# Patient Record
Sex: Male | Born: 1995 | Race: Black or African American | Hispanic: No | Marital: Single | Smoking: Never smoker
Health system: Southern US, Community
[De-identification: ages and names within clinical notes are randomized; demographics above are authoritative.]

## PROBLEM LIST (undated history)

## (undated) DIAGNOSIS — L511 Stevens-Johnson syndrome: Secondary | ICD-10-CM

## (undated) DIAGNOSIS — M8580 Other specified disorders of bone density and structure, unspecified site: Secondary | ICD-10-CM

## (undated) DIAGNOSIS — C9590 Leukemia, unspecified not having achieved remission: Secondary | ICD-10-CM

## (undated) DIAGNOSIS — J189 Pneumonia, unspecified organism: Secondary | ICD-10-CM

---
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
## 2014-05-09 ENCOUNTER — Emergency Department
Admission: EM | Admit: 2014-05-09 | Disposition: A | Discharge status: Home / Self Care | Payer: Self-pay | Source: Ambulatory Visit

## 2014-05-09 LAB — HM HIV SCREENING OFFERED

## 2014-05-09 NOTE — ED Notes (Signed)
Epistaxis x 2 hours. Denies history of the same. Hx of seasonal allergies, states he was making dinner when it started. Denies trauma.

## 2014-05-10 ENCOUNTER — Encounter: Payer: Self-pay | Admitting: Emergency Medicine

## 2014-05-10 NOTE — ED Provider Notes (Addendum)
History     Chief Complaint   Patient presents with    Epistaxis       HPI Comments: 18 year old male presents to the emergency department with epistaxis for approximately 4 to 4-1/2 hours, not initially improving with direct pressure.  Patient and mother deny bleeding diathesis, hypocoagulability, prior significant epistaxis.  Patient states the bleed again while he was making dinner, initially at gush of blood which slowed to a trickle.  He reports mild lightheadedness if he stands up too quickly, but denies palpitations, persistent lightheadedness, nausea, vomiting, or the sensation of postnasal drip.      History provided by:  Patient  Language interpreter used: No    Is this ED visit related to civilian activity for income:  Not work related      Past Medical History   Diagnosis Date    Leukemia     Decreased bone density     Stevens-Johnson syndrome     Blocked lacrimal duct 07/13/2011    Corneal abrasion 07/13/2011    Refractive error 07/13/2011            Past Surgical History   Procedure Laterality Date    Nasal lacrimal duct obstruction      Broviak placement         Family History   Problem Relation Age of Onset    Retinal detachment Father     Diabetes Other      paternal great grandmother    High blood pressure Other      paternal great grandmother    Birth defects Neg Hx     SIDS Neg Hx     Sudden death Neg Hx          Social History      reports that he has never smoked. He does not have any smokeless tobacco history on file. He reports that he drinks alcohol. His drug and sexual activity histories are not on file.    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   HENT: Positive for nosebleeds. Negative for sore throat and trouble swallowing.    Eyes: Negative for pain and visual disturbance.   Respiratory: Negative for shortness of breath.    Cardiovascular: Negative for chest pain.   Gastrointestinal: Negative for nausea, vomiting and abdominal pain.   Endocrine: Negative for polydipsia.   Genitourinary: Negative for flank pain.   Musculoskeletal: Negative for gait problem, neck pain and neck stiffness.   Skin: Negative for pallor and rash.   Neurological: Negative for tremors, syncope and headaches.   Psychiatric/Behavioral: Negative for confusion and agitation.  Physical Exam     ED Triage Vitals   BP Heart Rate Heart Rate(via Pulse Ox) Resp Temp Temp Source SpO2 O2 Device O2 Flow Rate   05/09/14 2254 05/09/14 2254 -- 05/09/14 2254 05/09/14 2254 05/09/14 2254 05/09/14 2254 05/09/14 2254 --   147/95 mmHg 73  16 36.7 C (98.1 F) TEMPORAL 95 % None (Room air)       Weight           05/09/14 2254           54.432 kg (120 lb)               Physical Exam   Constitutional: He is oriented to person, place, and time. He appears well-developed and well-nourished. No distress.   HENT:   Head: Normocephalic and atraumatic.   Nose: No septal deviation or nasal septal hematoma. No epistaxis (Small regions along the left anterior nasal septum which appear to be resolving epistaxis without continued bleeding).   Mouth/Throat: Oropharynx is clear and moist. No oropharyngeal exudate.   Eyes: Conjunctivae are normal. Right eye exhibits no discharge. Left eye exhibits no discharge.   Neck: No tracheal deviation present.   Cardiovascular: Normal rate,  regular rhythm and intact distal pulses.    Pulmonary/Chest: Effort normal and breath sounds normal. No stridor. No respiratory distress. He has no wheezes.   Abdominal: Soft. He exhibits no distension. There is no tenderness.   Neurological: He is alert and oriented to person, place, and time. He exhibits normal muscle tone.   Skin: Skin is warm and dry. He is not diaphoretic. No pallor.   Psychiatric: He has a normal mood and affect. His behavior is normal.   Nursing note and vitals reviewed.      Medical Decision Making        Initial Evaluation:  ED First Provider Contact    Date/Time Event User Comments    05/10/14 [phone] ED Provider First Contact FIELDS, AARON Initial Face to Face Provider Contact          Patient seen by me today 05/10/2014 at 0345    Assessment:  18 y.o., male comes to the ED with persistent epistaxis without history of the same, patient and mother deny known bleeding diathesis or hypocoagulability (is not on any blood thinners).  Hemodynamically stable.    Differential Diagnosis includes epistaxis, less likely nasal trauma, bleeding diathesis                Plan:   1.  Epistaxis is controlled at time of my evaluation.  No acute intervention required.  2.  I showed mother how to make a nasal clamp out of tongue depressors, gauze, and tape and demonstrated how to apply it in order to apply direct pressure to the anterior nasal septum for a solid 20-30 minutes without worrying about human fatigue.  I instructed her to assemble a nasal clamp in place it on Damyon's nose if the bleeding recurred, and it did not resolve in 20-30 minutes with direct pressure, to return to the emergency department.  3.  Discharge to home to followup with PCP.  I discuss if he develops recurrent nosebleeds over the next weeks to months, she should followup with his PCP to determine etiology.    Signa Kell, MD              Signa Kell, MD  05/10/14 [phone]    Resident Attestation:     Patient seen by me today,  05/10/2014 at approx 0256  History:   I reviewed this patient, reviewed the resident's note and agree.  Exam:   I examined this patient, reviewed the resident's note and agree.    Decision Making:   I discussed with the resident his/her documented decision making  and agree.      Author Lauretta Grill, MD    Lauretta Grill, MD  05/13/14 2230

## 2014-05-10 NOTE — Discharge Instructions (Signed)
You were seen and evaluated today in the Emergency Department at [facility] for a persistent nosebleed.  During your stay, we performed a Physical exam and found no ongoing bleeding or need for further intervention.  We gave you supplies to make a nose clamp to treat a possible recurrent nosebleed.  At this time, your symptoms have improved, we are discharging you home with plans to follow up with your Primary Care Physician Liliana Cline, Oneal Grout, MD).      If your symptoms return, worsen, or become persistent then we encourage you to seek further care, either with your Primary Care Physician Liliana Cline, Oneal Grout, MD) or here in the Emergency Department.  Specific symptoms to look out for include recurrent or persistent bleeding, worsening lightheadedness, palpitations, or difficulty breathing.  We have included additional educational information and instructions with your discharge packet.  If you have any questions about your care, please refer to this information or ask any nursing or medical staff while you are here in the Emergency Department.  Once you leave, if questions arise, we encourage you to contact your Primary Care Physician Liliana Cline, Oneal Grout, MD) for follow up as needed.

## 2014-05-10 NOTE — ED Notes (Addendum)
Pt presents with c/o nosebleed for about "3 1/2 hours", started while he was making dinner. Denies hx of the same, only seasonal allergies. Pt states the bleed started off as a "gush" and then became a slow trickle. Currently not actively bleeding. Pt in NAD. Will continue to monitor and tx per orders. Denies dizziness, vision changes, HA.

## 2014-05-10 NOTE — ED Notes (Signed)
Pt up and ambulatory without any assistance. Pt tolerating PO. Pt d/c instructions reviewed, pt comfortable with d/c planning. Pt understands d/c instructions and will follow up with PCP. Pt belongings are with pt. Pt safe to d/c at this time. Pt's mother will be driving pt home.

## 2015-11-03 ENCOUNTER — Other Ambulatory Visit
Admission: RE | Admit: 2015-11-03 | Discharge: 2015-11-03 | Disposition: A | Discharge status: Home / Self Care | Payer: Self-pay | Source: Ambulatory Visit | Attending: Pediatrics | Admitting: Pediatrics

## 2015-11-03 LAB — CBC AND DIFFERENTIAL
Baso # K/uL: 0 10*3/uL (ref 0.0–0.1)
Basophil %: 1 %
Eos # K/uL: 0.1 10*3/uL (ref 0.0–0.5)
Eosinophil %: 4 %
Hematocrit: 44 % (ref 40–51)
Hemoglobin: 15 g/dL (ref 13.7–17.5)
IMM Granulocytes #: 0 10*3/uL (ref 0.0–0.1)
IMM Granulocytes: 0.3 %
Lymph # K/uL: 1 10*3/uL — ABNORMAL LOW (ref 1.3–3.6)
Lymphocyte %: 33.8 %
MCH: 30 pg/cell (ref 26–32)
MCHC: 34 g/dL (ref 32–37)
MCV: 87 fL (ref 79–92)
Mono # K/uL: 0.3 10*3/uL (ref 0.3–0.8)
Monocyte %: 10.9 %
Neut # K/uL: 1.5 10*3/uL — ABNORMAL LOW (ref 1.8–5.4)
Nucl RBC # K/uL: 0 10*3/uL (ref 0.0–0.0)
Nucl RBC %: 0 /100 WBC (ref 0.0–0.2)
Platelets: 272 10*3/uL (ref 150–330)
RBC: 5 MIL/uL (ref 4.6–6.1)
RDW: 13.6 % (ref 11.6–14.4)
Seg Neut %: 50 %
WBC: 3 10*3/uL — ABNORMAL LOW (ref 4.2–9.1)

## 2015-11-03 LAB — TSH: TSH: 2.31 u[IU]/mL (ref 0.27–4.20)

## 2015-11-03 LAB — COMPREHENSIVE METABOLIC PANEL
ALT: 15 U/L (ref 0–50)
AST: 31 U/L (ref 0–50)
Albumin: 4.8 g/dL (ref 3.5–5.2)
Alk Phos: 66 U/L (ref 40–130)
Anion Gap: 18 — ABNORMAL HIGH (ref 7–16)
Bilirubin,Total: 0.3 mg/dL (ref 0.0–1.2)
CO2: 22 mmol/L (ref 20–28)
Calcium: 9.6 mg/dL (ref 9.3–10.5)
Chloride: 99 mmol/L (ref 96–108)
Creatinine: 0.98 mg/dL (ref 0.67–1.17)
GFR,Black: 126 *
GFR,Caucasian: 109 *
Glucose: 80 mg/dL (ref 60–99)
Lab: 14 mg/dL (ref 6–20)
Potassium: 4.5 mmol/L (ref 3.3–5.1)
Sodium: 139 mmol/L (ref 133–145)
Total Protein: 7.3 g/dL (ref 6.3–7.7)

## 2015-11-03 LAB — LIPID PANEL
Chol/HDL Ratio: 2.5
Cholesterol: 155 mg/dL
HDL: 62 mg/dL
LDL Calculated: 86 mg/dL
Non HDL Cholesterol: 93 mg/dL
Triglycerides: 33 mg/dL

## 2015-11-05 ENCOUNTER — Encounter: Payer: Self-pay | Admitting: Pediatric Hematology and Oncology

## 2015-11-05 ENCOUNTER — Other Ambulatory Visit
Admission: RE | Admit: 2015-11-05 | Discharge: 2015-11-05 | Disposition: A | Discharge status: Home / Self Care | Payer: Self-pay | Source: Ambulatory Visit | Attending: Pediatric Hematology and Oncology | Admitting: Pediatric Hematology and Oncology

## 2015-11-05 ENCOUNTER — Ambulatory Visit: Payer: Self-pay | Admitting: Pediatric Hematology and Oncology

## 2015-11-05 VITALS — BP 124/78 | HR 61 | Temp 99.3°F | Resp 18 | Ht 65.63 in | Wt 127.0 lb

## 2015-11-05 DIAGNOSIS — C9590 Leukemia, unspecified not having achieved remission: Secondary | ICD-10-CM

## 2015-11-05 DIAGNOSIS — G43909 Migraine, unspecified, not intractable, without status migrainosus: Secondary | ICD-10-CM

## 2015-11-05 LAB — COMPREHENSIVE METABOLIC PANEL
ALT: 15 U/L (ref 0–50)
AST: 30 U/L (ref 0–50)
Albumin: 4.9 g/dL (ref 3.5–5.2)
Alk Phos: 64 U/L (ref 40–130)
Anion Gap: 13 (ref 7–16)
Bilirubin,Total: 0.4 mg/dL (ref 0.0–1.2)
CO2: 27 mmol/L (ref 20–28)
Calcium: 9.9 mg/dL (ref 9.3–10.5)
Chloride: 101 mmol/L (ref 96–108)
Creatinine: 0.98 mg/dL (ref 0.67–1.17)
GFR,Black: 126 *
GFR,Caucasian: 109 *
Glucose: 81 mg/dL (ref 60–99)
Lab: 13 mg/dL (ref 6–20)
Potassium: 4.6 mmol/L (ref 3.3–5.1)
Sodium: 141 mmol/L (ref 133–145)
Total Protein: 7.3 g/dL (ref 6.3–7.7)

## 2015-11-05 LAB — VITAMIN D
25-OH VIT D2: 6 ng/mL
25-OH VIT D3: 15 ng/mL
25-OH Vit Total: 21 ng/mL — ABNORMAL LOW (ref 30–60)

## 2015-11-05 LAB — CBC AND DIFFERENTIAL
Baso # K/uL: 0 10*3/uL (ref 0.0–0.1)
Basophil %: 0.8 %
Eos # K/uL: 0.1 10*3/uL (ref 0.0–0.5)
Eosinophil %: 2.6 %
Hematocrit: 44 % (ref 40–51)
Hemoglobin: 15.3 g/dL (ref 13.7–17.5)
IMM Granulocytes #: 0 10*3/uL (ref 0.0–0.1)
IMM Granulocytes: 0 %
Lymph # K/uL: 1.3 10*3/uL (ref 1.3–3.6)
Lymphocyte %: 33.9 %
MCH: 30 pg/cell (ref 26–32)
MCHC: 35 g/dL (ref 32–37)
MCV: 87 fL (ref 79–92)
Mono # K/uL: 0.4 10*3/uL (ref 0.3–0.8)
Monocyte %: 10.5 %
Neut # K/uL: 2 10*3/uL (ref 1.8–5.4)
Nucl RBC # K/uL: 0 10*3/uL (ref 0.0–0.0)
Nucl RBC %: 0 /100 WBC (ref 0.0–0.2)
Platelets: 256 10*3/uL (ref 150–330)
RBC: 5.1 MIL/uL (ref 4.6–6.1)
RDW: 13.3 % (ref 11.6–14.4)
Seg Neut %: 52.2 %
WBC: 3.9 10*3/uL — ABNORMAL LOW (ref 4.2–9.1)

## 2015-11-05 LAB — LACTATE DEHYDROGENASE: LD: 185 U/L (ref 118–225)

## 2015-11-05 LAB — DUPLICATE SLIDE: Slide Sent To: 4

## 2015-11-05 LAB — NEUTROPHIL #-INSTRUMENT: Neutrophil #-Instrument: 2 10*3/uL

## 2015-11-05 LAB — SEDIMENTATION RATE, AUTOMATED: Sedimentation Rate: 2 mm/hr (ref 0–15)

## 2015-11-05 NOTE — Progress Notes (Addendum)
Pediatric Hematology Oncology Clinic Note    CC: Leukopenia and h/o ALL. Referred by PCP, Dr. Liliana Kirk.    HPI:  Awoke from sleep two days ago with HA. Sharp, R>L, mostly temple region. Developed facial numbness on the right side that lasted several hours (went back to sleep and then woke up again with same symptoms). No vision changes, hearing changes or tinnitus. No nausea at the time (did have some yesterday), phonophobia, or photophobia. No familial or personal history of migraines. Saw PCP in mid-day. Instructed to take some tylenol which helped a bit. Labwork performed and showed normal CMP and CBC remarkable for leukopenia (WBC 3.0 with ANC 1500 and LNC 1000). Previous to this episode, Gregory Kirk was feeling in his usual good health. No recent illness, travel, or change in medications.     Gregory Kirk was last seen in peds hem-onc clinic by Dr. Marilynne Kirk 05/31/2007. Since then, he reports good health. He continues to take Zyrtec for environmental allergies. He was seen by Dr. Narda Kirk (Cardiology) in 08/2011 and had normal TTE and EKG at that time. Routine cardiac screening of ventricular function was recommended every 5 years.    ROS:   Gen- denies fever, weight loss, change in activity/energy  HEENT- denies vision or hearing changes, rhinorrhea, sore throat, dysphagia, oral lesions, odynophagia  Resp- denies dyspnea, cough  CV- denies chest pain, palpitations, LE edema  GI- +nausea. Denies vomiting, abdominal pain, diarrhea, constipation  GU- Normal UOP. Denies dysuria, hematuria  MSK- +chronic knee pain. Denies myalgias, arthralgias, joint swelling or redness  Neuro- +headache, +paresthesias/weakness as above. Denies LOC, lightheadedness, tremor, gait disturbance.  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 CBC showed 12% blasts without significant leukopenia, anemia, or  thrombocytopenia. Mediastinal mass noted on CXR. 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 No evidence of CNS disease at diagnosis.  Treated per [id number]. Randomized to doxorubicin with Zinecard and Erwinia asparaginase. Cumulative dose doxorubicin 300 mg/m2. Also received 1,800 cGy of cranial radiation therapy. Treatment was completed October 1998.    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 CBC significant for 69% blasts with minimal anemia and no thrombocytopenia. Reinduced with vincristine, idarubicin, Decadron, PEG asparaginase, intrathecal ARA-C. Subsequently received another course of therapy with vincristine, Decadron,   PEG-asparaginase, and intrathecal methotrexate. A third induction course of   cyclophosphamide and VP-16 with intrathecal methotrexate was then   administered.     After induction received consolidation with B43-PIP immunotoxin at the [facility] in [place], [place]. He received three cycles,   9/11 - 07/08/1999, 9/20-9/22/2000, and 10/03-10/02/1999. He then received   interim maintenance and was to receive further B43-PAP therapy but the   study was closed. He then completed chemotherapy with   etoposide/cyclophosphamide, methotrexate/6-MP, and ara-C/VP-16. The VP-16 in the fourth cycle needed to be held throughout most of the course due to low counts. Gregory Kirk received 19 cycles of this chemotherapy and completed all therapy two years from relapse in May 2002. Other than neutropenia requiring holding chemotherapy and hypogammaglobulinemia with replacement IV IG, [name] generally tolerated his therapy well. He has had no laboratory or clinical evidence of relapse since completing therapy.     Noted to have mild neutropenia and leukopenia in June 2004 which subsequently improved.  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  Gregory Kirk 10/2003 requiring admission to [facility]. Thought to be 2/2 ibuprofen.     PSurgHx:  Orchiopexy 05/2004    Fam Hx:  Mother with Breast Cancer in 2013    Social Hx:  -Working at [employer]. Going to school [employer]) for marketing, but still working stuff out and will not be in school this semester.  -Living with parents, alternates between [employer] and Dad's house  -Non smoker. Rare alcohol. No recreational drug use.    Meds: zyrtec daily, tylenol PRN    Allergies: reglan, ibuprofen, morphine    Immunizations: UTD except no influenza yet this year      Physical Exam:  Vitals:    11/05/15 1401   BP: 124/78   Pulse: 61   Resp: 18   Temp: 37.4 C (99.3 F)   Weight: 57.6 kg (126 lb 15.8 oz)   Height: 166.7 cm (5' 5.63")     Gen- well appearing, engaged young adult  HEENT- bitemporal diameter appears narrow. No scleral icterus. Right TM normal. Left external canal with cerumen impaction. No rhinorrhea. MMM. No oral lesions.   CV- RRR. No murmurs.   Resp- Easy work of breathing. CTAB. No clubbing  Abd- Soft, nontender, nondistended. Normoactive bowel sounds. No HSM.  GU- Normal circumcised penis. Testicles with smooth contours, no nodules/pain/swelling. Tanner Stage V.  Neuro- CN II-XII grossly intact. Upper and lower strength 5/5 bilaterally. Biceps and patellar reflex 2+ bilaterally. No tremor.   MSK- Left leg minimally shorter than right.   Skin- Hyperpigmented macules on arms, face, back. No bruising or petechiae.    Labs  CMP from 1/9 and repeat today are unremarkable  LDH 185  WBC 3.9 (3.0 on 1/9)  Hb/Hct 15.3/44  Plt 256  ESR 2  Recent Results (from the past 72 hour(s))   CBC and differential    Collection Time: 11/05/15  3:51 PM   Result Value Ref Range    WBC 3.9 (L) 4.2 - 9.1 THOU/uL    RBC 5.1 4.6 - 6.1 MIL/uL    Hemoglobin 15.3 13.7 - 17.5 g/dL    Hematocrit 44 40 - 51 %    MCV 87 79 - 92 fL    MCH 30 26 - 32 pg/cell    MCHC 35 32 - 37 g/dL    RDW 13.3 11.6 - 14.4 %    Platelets 256  150 - 330 THOU/uL    Seg Neut % 52.2 %    Lymphocyte % 33.9 %    Monocyte % 10.5 %    Eosinophil % 2.6 %    Basophil % 0.8 %    Neut # K/uL 2.0 1.8 - 5.4 THOU/uL    Lymph # K/uL 1.3 1.3 - 3.6 THOU/uL    Mono # K/uL 0.4 0.3 - 0.8 THOU/uL    Eos # K/uL 0.1 0.0 - 0.5 THOU/uL    Baso # K/uL 0.0 0.0 - 0.1 THOU/uL    Nucl RBC % 0.0 0.0 - 0.2 /100 WBC    Nucl RBC # K/uL 0.0 0.0 - 0.0 THOU/uL    IMM Granulocytes # 0.0 0.0 - 0.1 THOU/uL    IMM Granulocytes 0.0 %   Comprehensive metabolic panel    Collection Time: 11/05/15  3:51 PM   Result Value Ref Range    Sodium 141 133 - 145 mmol/L  Potassium 4.6 3.3 - 5.1 mmol/L    Chloride 101 96 - 108 mmol/L    CO2 27 20 - 28 mmol/L    Anion Gap 13 7 - 16    UN 13 6 - 20 mg/dL    Creatinine 0.98 0.67 - 1.17 mg/dL    GFR,Caucasian 109 *    GFR,Black 126 *    Glucose 81 60 - 99 mg/dL    Calcium 9.9 9.3 - 10.5 mg/dL    Total Protein 7.3 6.3 - 7.7 g/dL    Albumin 4.9 3.5 - 5.2 g/dL    Bilirubin,Total 0.4 0.0 - 1.2 mg/dL    AST 30 0 - 50 U/L    ALT 15 0 - 50 U/L    Alk Phos 64 40 - 130 U/L   Lactate dehydrogenase    Collection Time: 11/05/15  3:51 PM   Result Value Ref Range    LD 185 118 - 225 U/L   Sedimentation rate, automated    Collection Time: 11/05/15  3:51 PM   Result Value Ref Range    Sedimentation Rate 2 0 - 15 mm/hr   Duplicate slide    Collection Time: 11/05/15  3:51 PM   Result Value Ref Range    Slide Sent 16:46     Slide Sent To 4 to jeff andol    Neutrophil #-Instrument    Collection Time: 11/05/15  3:51 PM   Result Value Ref Range    Neutrophil #-Instrument 2.0 THOU/uL     Imaging:  Gregory Kirk MRI (11/06/15)- normal    Assessment/Plan:  Gregory Kirk is a 20 yo male with h/o high-risk ALL c/b relapse in 2000 with last treatment in 2002 who presents following labwork showing mild leukopenia of 3.0 in setting of acute onset, severe headache with simultaneous facial paresthesias and weakness. Leukopenia now improved on today's blood draw, and neurologic symptoms have resolved.  Although it is unlikely that there is an underlying structural cause for this episode given improvement with minimal intervention, he is at increased risk for second malignancy secondary to his ALL treatment (which did include cranial radiation). Therefore, will pursue further imaging with MRI brain at this time. DDx includes migraine though also seems unlikely given lack of personal or familial history. Lastly, though I do not know of increased risk for multiple sclerosis due to his treatment, this would be a clinical entity with remitting neurologic findings (though seems unlikely given concurrence of headache, gender, no previous neurologic events).    -MRI brain to be scheduled tomorrow  -No need for further CBC monitoring at the moment  -Due to anthracycline exposure, should have regular cardiology follow up every 3-5 years. Last echo done 08/2011.  -Recommend routine follow up with Dr. Marilynne Kirk for long term effects of therapy    Discussed with Dr. Elizabeth Palau, MD  Internal Medicine/Pediatrics Resident, PGY-2    Attending Attestation:    I have seen and examined this patient fully and I have reviewed all pertinent labs and medical records.  I agree with the above note in its entirety.  In summary, Gregory Kirk is a now 20 year old male with history of acute lymphoblastic leukemia, diagnosed in 1996, also relapse of ALL in 2000, and has done well and been in remission since that time.  He did receive cranial radiation and relatively high doses of chemotherapy.  He presented to PCP earlier this week with some numbness and possible migraine symptoms; labs done  which were notable for leukopenia (WBC 3.0).    Given his significant prior treatment (including radiation), we performed a comprehensive workup today, which included the following:    1. All labs repeated and essentially normal.  WBC 3.9 which is improved.  The leukopenia was transient and likely related to viral or benign factors.  All other blood  work normal.    2. MRI brain performed and reassuringly normal, excluding stroke/bleed/pathology that may have been related to his prior treatment.    We recommend follow up in survivor clinic with Dr Daphane Shepherd to evaluate for other survivor surveillance.    Plans discussed with Gregory Kirk, mom, dad.  Unable to reach family by phone today though left message with reassuring results.  Please contact me with any questions/concerns regarding this patient.    Sterling Big, MD MS  Pediatric Hematology/Oncology Attending  Pager [phone]

## 2015-11-06 ENCOUNTER — Ambulatory Visit
Admission: RE | Admit: 2015-11-06 | Discharge: 2015-11-06 | Disposition: A | Discharge status: Home / Self Care | Payer: Self-pay | Source: Ambulatory Visit | Attending: Pediatric Hematology and Oncology | Admitting: Pediatric Hematology and Oncology

## 2015-11-06 MED ORDER — GADOBUTROL 1 MMOL/ML (GADAVIST) IV SOLN *WRAPPED*
7.0000 mL | Freq: Once | INTRAVENOUS | Status: AC
Start: 2015-11-06 — End: 2015-11-06
  Administered 2015-11-06: 7 mL via INTRAVENOUS

## 2015-11-07 DIAGNOSIS — G43909 Migraine, unspecified, not intractable, without status migrainosus: Secondary | ICD-10-CM | POA: Insufficient documentation

## 2017-06-14 DIAGNOSIS — Y9301 Activity, walking, marching and hiking: Secondary | ICD-10-CM | POA: Insufficient documentation

## 2017-06-14 DIAGNOSIS — Y9289 Other specified places as the place of occurrence of the external cause: Secondary | ICD-10-CM | POA: Insufficient documentation

## 2017-06-14 DIAGNOSIS — S7001XA Contusion of right hip, initial encounter: Secondary | ICD-10-CM | POA: Insufficient documentation

## 2017-06-14 DIAGNOSIS — S50311A Abrasion of right elbow, initial encounter: Secondary | ICD-10-CM | POA: Insufficient documentation

## 2017-06-14 DIAGNOSIS — Y999 Unspecified external cause status: Secondary | ICD-10-CM | POA: Insufficient documentation

## 2017-06-14 DIAGNOSIS — S138XXA Sprain of joints and ligaments of other parts of neck, initial encounter: Secondary | ICD-10-CM | POA: Insufficient documentation

## 2017-06-15 ENCOUNTER — Emergency Department (HOSPITAL_COMMUNITY)
Admission: EM | Admit: 2017-06-15 | Discharge: 2017-06-15 | Disposition: A | Discharge status: Home / Self Care | Payer: Self-pay | Attending: Emergency Medicine | Admitting: Emergency Medicine

## 2017-06-15 ENCOUNTER — Encounter (HOSPITAL_COMMUNITY): Payer: Self-pay | Admitting: *Deleted

## 2017-06-15 ENCOUNTER — Emergency Department (HOSPITAL_COMMUNITY): Payer: Self-pay

## 2017-06-15 DIAGNOSIS — S139XXA Sprain of joints and ligaments of unspecified parts of neck, initial encounter: Secondary | ICD-10-CM

## 2017-06-15 DIAGNOSIS — R52 Pain, unspecified: Secondary | ICD-10-CM

## 2017-06-15 DIAGNOSIS — S7001XA Contusion of right hip, initial encounter: Secondary | ICD-10-CM

## 2017-06-15 DIAGNOSIS — S50311A Abrasion of right elbow, initial encounter: Secondary | ICD-10-CM

## 2017-06-15 HISTORY — DX: Pneumonia, unspecified organism: J18.9

## 2017-06-15 HISTORY — DX: Stevens-Johnson syndrome: L51.1

## 2017-06-15 HISTORY — DX: Leukemia, unspecified not having achieved remission: C95.90

## 2017-06-15 NOTE — ED Notes (Signed)
Bed: WTR6 Expected date:  Expected time:  Means of arrival:  Comments: 

## 2017-06-15 NOTE — Discharge Instructions (Signed)
Continue wound care to right elbow. Take tylenol for pain. Try icing your right hip several times a day. Rest. Follow up with family doctor as needed.  Xrays all normal today
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
## 2019-10-09 ENCOUNTER — Telehealth: Payer: Self-pay | Admitting: Physician Assistant

## 2019-10-09 DIAGNOSIS — Z20822 Contact with and (suspected) exposure to covid-19: Secondary | ICD-10-CM

## 2019-10-09 MED ORDER — LOPERAMIDE HCL 2 MG PO CAPS: 2.0000 mg | ORAL_CAPSULE | Freq: Four times a day (QID) | ORAL | 0 refills | Status: AC | PRN

## 2019-10-09 MED ORDER — PROMETHAZINE-DM 6.25-15 MG/5ML PO SYRP: 5.0000 mL | ORAL_SOLUTION | Freq: Four times a day (QID) | ORAL | 0 refills | Status: AC | PRN

## 2019-10-09 NOTE — Progress Notes (Signed)
E-Visit for Corona Virus Screening   Your current symptoms could be consistent with the coronavirus.  Many health care providers can now test patients at their office but not all are.  [facility] has multiple testing sites. For information on our COVID testing locations and hours go to [email or web address]   Testing Information: The COVID-19 Community Testing sites will begin testing BY APPOINTMENT ONLY starting the week of December 15th and December 16th (see go-live dates by location below).  You can begin scheduling online at [email or web address]  If you do not have access to a smart phone or computer you may call [phone] for an appointment. . In addition, starting the week of December 14th we will move INDOORS for testing (see locations below). . Community testing site appointment hours will be 8 a.m. to 3:30 p.m.   Testing Locations: [place] will begin Tuesday December 15th. Closed on Monday December 14th to relocate indoors to [address] [place] [address] [facility] Appointments will begin Wednesday December 16th. Closed on Tuesday December 15th for move to indoors at [place]. [address] [place], [place] [address] [facility] Appointments will begin Wednesday December 16th. Closed on Tuesday December 15th for move to indoors at [address] [place] [address]   We are enrolling you in our [place] for [facility] . Daily you will receive a questionnaire within the [facility] website. Our COVID 19 response team will be monitoring your responses daily.  Please quarantine yourself while awaiting your test results. If you develop fever/cough/breathlessness, please stay home for 10 days with improving symptoms and until you have had 24 hours of no fever (without taking a fever reducer).  You should wear a mask or cloth face covering over your nose and mouth if you must be around other people or animals, including pets (even at  home). Try to stay at least 6 feet away from other people. This will protect the people around you.  Please continue good preventive care measures, including:  frequent hand-washing, avoid touching your face, cover coughs/sneezes, stay out of crowds and keep a 6 foot distance from others.  COVID-19 is a respiratory illness with symptoms that are similar to the flu. Symptoms are typically mild to moderate, but there have been cases of severe illness and death due to the virus.   The following symptoms may appear 2-14 days after exposure: . Fever . Cough . Shortness of breath or difficulty breathing . Chills . Repeated shaking with chills . Muscle pain . Headache . Sore throat . New loss of taste or smell . Fatigue . Congestion or runny nose . Nausea or vomiting . Diarrhea  Go to the nearest hospital ED for assessment if fever/cough/breathlessness are severe or illness seems like a threat to life.  It is vitally important that if you feel that you have an infection such as this virus or any other virus that you stay home and away from places where you may spread it to others.  You should avoid contact with people age 71 and older.   You can use medication such as A prescription cough medication called Phenergan DM 6.25 mg/15 mg. You make take one teaspoon / 5 ml every 4-6 hours as needed for cough   You may use Imodium for diarrhea.  You may also take acetaminophen (Tylenol) as needed for fever.  Reduce your risk of any infection by using the same precautions used for avoiding the common cold or flu:  .  Wash your hands often with soap and warm water for at least 20 seconds.  If soap and water are not readily available, use an alcohol-based hand sanitizer with at least 60% alcohol.  . If coughing or sneezing, cover your mouth and nose by coughing or sneezing into the elbow areas of your shirt or coat, into a tissue or into your sleeve (not your hands). . Avoid shaking hands with others and  consider head nods or verbal greetings only. . Avoid touching your eyes, nose, or mouth with unwashed hands.  . Avoid close contact with people who are sick. . Avoid places or events with large numbers of people in one location, like concerts or sporting events. . Carefully consider travel plans you have or are making. . If you are planning any travel outside or inside the [place], visit the CDC's Travelers' Health webpage for the latest health notices. . If you have some symptoms but not all symptoms, continue to monitor at home and seek medical attention if your symptoms worsen. . If you are having a medical emergency, call 911.  HOME CARE . Only take medications as instructed by your medical team. . Drink plenty of fluids and get plenty of rest. . A steam or ultrasonic humidifier can help if you have congestion.   GET HELP RIGHT AWAY IF YOU HAVE EMERGENCY WARNING SIGNS** FOR COVID-19. If you or someone is showing any of these signs seek emergency medical care immediately. Call 911 or proceed to your closest emergency facility if: . You develop worsening high fever. . Trouble breathing . Bluish lips or face . Persistent pain or pressure in the chest . New confusion . Inability to wake or stay awake . You cough up blood. . Your symptoms become more severe  **This list is not all possible symptoms. Contact your medical provider for any symptoms that are sever or concerning to you.  MAKE SURE YOU   Understand these instructions.  Will watch your condition.  Will get help right away if you are not doing well or get worse.  Your e-visit answers were reviewed by a board certified advanced clinical practitioner to complete your personal care plan.  Depending on the condition, your plan could have included both over the counter or prescription medications.  If there is a problem please reply once you have received a response from your provider.  Your safety is important to [place].  If you have  drug allergies check your prescription carefully.    You can use MyChart to ask questions about today's visit, request a non-urgent call back, or ask for a work or school excuse for 24 hours related to this e-Visit. If it has been greater than 24 hours you will need to follow up with your provider, or enter a new e-Visit to address those concerns. You will get an e-mail in the next two days asking about your experience.  I hope that your e-visit has been valuable and will speed your recovery. Thank you for using e-visits.   Greater than 5 minutes, yet less than 10 minutes of time have been spent researching, coordinating, and implementing care for this patient today

## 2019-10-11 ENCOUNTER — Other Ambulatory Visit: Payer: Self-pay

## 2019-10-13 ENCOUNTER — Telehealth: Payer: Self-pay | Admitting: Physician Assistant

## 2019-10-13 DIAGNOSIS — Z20822 Contact with and (suspected) exposure to covid-19: Secondary | ICD-10-CM

## 2019-10-13 NOTE — Progress Notes (Signed)
I have spent 5 minutes in review of e-visit questionnaire, review and updating patient chart, medical decision making and response to patient.   Ismelda Weatherman Cody Mani Celestin, PA-C    

## 2019-10-13 NOTE — Progress Notes (Signed)
E-Visit for Corona Virus Screening   Your current symptoms could be consistent with the coronavirus. I see you had a visit on 10/09/2019 and were felt to have COVID. Make sure to go get tested. If you had a negative test the day symptoms began I would recommend being retested.  Many health care providers can now test patients at their office but not all are.  [facility] has multiple testing sites. For information on our [place] testing locations and hours go to [email or web address]  We are enrolling you in our [place] for [facility] . Daily you will receive a questionnaire within the [facility] website. Our COVID 19 response team will be monitoring your responses daily.  Testing Information: The COVID-19 Community Testing sites will begin testing BY APPOINTMENT ONLY.  You can schedule online at [email or web address]  If you do not have access to a smart phone or computer you may call [phone] for an appointment.  Testing Locations: Appointment schedule is 8 am to 3:30 pm at all sites  [facility] indoors at [address] [place] [address] [facility]  indoors at [place]. [address] [place], [place] [address] [facility] indoors at [address] [place] [address]  Additional testing sites in the Community:  . For [facility] Testing sites in [facility]  FaceUpdate.uy  . For Pop-up testing sites in [place]  BowlDirectory.co.uk  . For Testing sites with regular hours [name]  . For [place] [email or web address]  . For [facility] BasicJet.[email or web address]  . For [facility] testing in [place] and [employer]  BasicJet.[email or web address]  . For [facility] testing in [facility]   [email or web address].[email or web address]  For  more information about community testing call [phone]   We are enrolling you in our [place] for [place] . Daily you will receive a questionnaire within the [facility] [place] website. Our COVID 19 response team will be monitoring your responses daily.  Please quarantine yourself while awaiting your test results. If you develop fever/cough/breathlessness, please stay home for 10 days with improving symptoms and until you have had 24 hours of no fever (without taking a fever reducer).  You should wear a mask or cloth face covering over your nose and mouth if you must be around other people or animals, including pets (even at home). Try to stay at least 6 feet away from other people. This will protect the people around you.  Please continue good preventive care measures, including:  frequent hand-washing, avoid touching your face, cover coughs/sneezes, stay out of crowds and keep a 6 foot distance from others.  COVID-19 is a respiratory illness with symptoms that are similar to the flu. Symptoms are typically mild to moderate, but there have been cases of severe illness and death due to the virus.   The following symptoms may appear 2-14 days after exposure: . Fever . Cough . Shortness of breath or difficulty breathing . Chills . Repeated shaking with chills . Muscle pain . Headache . Sore throat . New loss of taste or smell . Fatigue . Congestion or runny nose . Nausea or vomiting . Diarrhea  Go to the nearest hospital ED for assessment if fever/cough/breathlessness are severe or illness seems like a threat to life.  It is vitally important that if you feel that you have an infection such as this virus or any other virus that you stay home and away from places where you may spread it to  others.  You  should avoid contact with people age 40 and older.    You may also take acetaminophen (Tylenol) as needed for fever.  Reduce your risk of any infection by using the same precautions used for avoiding the common cold or flu:  Mar[place] Kitchen Wash your hands often with soap and warm water for at least 20 seconds.  If soap and water are not readily available, use an alcohol-based hand sanitizer with at least 60% alcohol.  . If coughing or sneezing, cover your mouth and nose by coughing or sneezing into the elbow areas of your shirt or coat, into a tissue or into your sleeve (not your hands). . Avoid shaking hands with others and consider head nods or verbal greetings only. . Avoid touching your eyes, nose, or mouth with unwashed hands.  . Avoid close contact with people who are sick. . Avoid places or events with large numbers of people in one location, like concerts or sporting events. . Carefully consider travel plans you have or are making. . If you are planning any travel outside or inside the [place], visit the CDC's Travelers' Health webpage for the latest health notices. . If you have some symptoms but not all symptoms, continue to monitor at home and seek medical attention if your symptoms worsen. . If you are having a medical emergency, call 911.  HOME CARE . Only take medications as instructed by your medical team. . Drink plenty of fluids and get plenty of rest. . A steam or ultrasonic humidifier can help if you have congestion.   GET HELP RIGHT AWAY IF YOU HAVE EMERGENCY WARNING SIGNS** FOR COVID-19. If you or someone is showing any of these signs seek emergency medical care immediately. Call 911 or proceed to your closest emergency facility if: . You develop worsening high fever. . Trouble breathing . Bluish lips or face . Persistent pain or pressure in the chest . New confusion . Inability to wake or stay awake . You cough up blood. . Your symptoms become more severe  **This list  is not all possible symptoms. Contact your medical provider for any symptoms that are sever or concerning to you.  MAKE SURE YOU   Understand these instructions.  Will watch your condition.  Will get help right away if you are not doing well or get worse.  Your e-visit answers were reviewed by a board certified advanced clinical practitioner to complete your personal care plan.  Depending on the condition, your plan could have included both over the counter or prescription medications.  If there is a problem please reply once you have received a response from your provider.  Your safety is important to [place].  If you have drug allergies check your prescription carefully.    You can use MyChart to ask questions about today's visit, request a non-urgent call back, or ask for a work or school excuse for 24 hours related to this e-Visit. If it has been greater than 24 hours you will need to follow up with your provider, or enter a new e-Visit to address those concerns. You will get an e-mail in the next two days asking about your experience.  I hope that your e-visit has been valuable and will speed your recovery. Thank you for using e-visits.

## 2019-10-15 ENCOUNTER — Ambulatory Visit (INDEPENDENT_AMBULATORY_CARE_PROVIDER_SITE_OTHER)
Admission: RE | Admit: 2019-10-15 | Discharge: 2019-10-15 | Disposition: A | Discharge status: Home / Self Care | Payer: Self-pay | Source: Ambulatory Visit

## 2019-10-15 ENCOUNTER — Encounter (INDEPENDENT_AMBULATORY_CARE_PROVIDER_SITE_OTHER): Payer: Self-pay

## 2019-10-15 DIAGNOSIS — R0602 Shortness of breath: Secondary | ICD-10-CM

## 2019-10-15 DIAGNOSIS — U071 COVID-19: Secondary | ICD-10-CM

## 2019-10-15 MED ORDER — DM-GUAIFENESIN ER 30-600 MG PO TB12: 1.0000 | ORAL_TABLET | Freq: Two times a day (BID) | ORAL | 0 refills | Status: AC

## 2019-10-15 MED ORDER — BENZONATATE 200 MG PO CAPS: 200.0000 mg | ORAL_CAPSULE | Freq: Three times a day (TID) | ORAL | 0 refills | Status: AC | PRN

## 2019-10-15 MED ORDER — ALBUTEROL SULFATE HFA 108 (90 BASE) MCG/ACT IN AERS: 1.0000 | INHALATION_SPRAY | Freq: Four times a day (QID) | RESPIRATORY_TRACT | 0 refills | Status: AC | PRN

## 2019-10-15 NOTE — ED Provider Notes (Signed)
Virtual Visit via Video Note:  Bradley Dougherty  initiated request for Telemedicine visit with [facility] Urgent Care team. I connected with Bradley Dougherty  on 10/15/2019 at 12:14 PM  for a synchronized telemedicine visit using a video enabled HIPPA compliant telemedicine application. I verified that I am speaking with Bradley Dougherty  using two identifiers. Abeer Iversen C Vieva Brummitt, PA-C  was physically located in a [facility] Urgent care site and Berge Bournes was located at a different location.   The limitations of evaluation and management by telemedicine as well as the availability of in-person appointments were discussed. Patient was informed that he  may incur a bill ( including co-pay) for this virtual visit encounter. Bradley Dougherty  expressed understanding and gave verbal consent to proceed with virtual visit.     History of Present Illness:Bradley Dougherty  is a 23 y.o. male presents for evaluation of shortness of breath in the setting of Covid.  Patient tested positive for Covid last week.  Symptoms began on Tuesday/Wednesday, received positive result on Friday.  Overall he has felt better and is no longer running fevers.  He endorses a slight cough, and some congestion, but overall the symptoms have gotten better.  His main concern is his breathing.  He notes that he has had a chest tightness as well as feeling shortness of breath.  Occasionally will feel short of breath with just resting, but feels short of breath more so with exertion.  States that he has history of asthma when he was younger, but has not had any issues in years.  Denies any tobacco use or smoking.  Denies prior DVT/PE.  Denies any leg pain or leg swelling.  Denies any recent travel.     Allergies  Allergen Reactions  . Motrin [Ibuprofen] Anaphylaxis    Steven-Johnson Syndrome  . Reglan [Metoclopramide]      Past Medical History:  Diagnosis Date  . Leukemia ([place])   . Pneumonia   . Stevens-Johnson syndrome (HCC)      Social History    Tobacco Use  . Smoking status: Never Smoker  . Smokeless tobacco: Never Used  Substance Use Topics  . Alcohol use: No  . Drug use: No        Observations/Objective: Physical Exam  Constitutional: He is oriented to person, place, and time and well-developed, well-nourished, and in no distress. No distress.  HENT:  Head: Normocephalic and atraumatic.  Neck:  Full active range of motion of neck  Pulmonary/Chest: Effort normal. No respiratory distress.  Speaking in full sentences, no coughing during visit  Musculoskeletal:     Comments: No tenderness to palpation of anterior chest  Neurological: He is alert and oriented to person, place, and time.  Speech clear, face symmetric     Assessment and Plan:    ICD-10-CM   1. COVID-19 virus infection  U07.1   2. Shortness of breath  R06.02     Patient appears stable on video, speaking full sentences, does not appear to be in any respiratory distress.  Shortness of breath seems intermittent.  Symptoms most likely side effects from Covid.  Negative risk factors for DVT/PE.  Will treat symptomatically at this time with albuterol inhaler and continued symptomatic and supportive care of Covid, resting of fluids.  Advised if not seeing any improvement with the medicines provided her symptoms worsening to follow-up in person in order to have vitals checked, lungs auscultated and possible EKG/chest x-ray as needed.  Discussed strict return precautions. Patient verbalized  understanding and is agreeable with plan.    Follow Up Instructions:     I discussed the assessment and treatment plan with the patient. The patient was provided an opportunity to ask questions and all were answered. The patient agreed with the plan and demonstrated an understanding of the instructions.   The patient was advised to call back or seek an in-person evaluation if the symptoms worsen or if the condition fails to improve as anticipated.      Janith Lima, PA-C  10/15/2019 12:14 PM         Janith Lima, PA-C 10/15/19 1215

## 2019-10-15 NOTE — Discharge Instructions (Addendum)
Albuterol inhaler as needed for shortness of breath, chest tightness Tessalon for cough as needed every 8 hours Mucinex DM twice daily for further congestion  Follow up in person if not improving or symptoms worsening

## 2019-10-16 ENCOUNTER — Encounter (INDEPENDENT_AMBULATORY_CARE_PROVIDER_SITE_OTHER): Payer: Self-pay

## 2019-10-17 ENCOUNTER — Encounter (INDEPENDENT_AMBULATORY_CARE_PROVIDER_SITE_OTHER): Payer: Self-pay

## 2019-10-19 ENCOUNTER — Encounter (INDEPENDENT_AMBULATORY_CARE_PROVIDER_SITE_OTHER): Payer: Self-pay

## 2019-10-20 ENCOUNTER — Encounter (INDEPENDENT_AMBULATORY_CARE_PROVIDER_SITE_OTHER): Payer: Self-pay

## 2019-10-22 ENCOUNTER — Encounter (INDEPENDENT_AMBULATORY_CARE_PROVIDER_SITE_OTHER): Payer: Self-pay

## 2019-10-23 ENCOUNTER — Encounter (INDEPENDENT_AMBULATORY_CARE_PROVIDER_SITE_OTHER): Payer: Self-pay

## 2019-10-24 ENCOUNTER — Encounter (INDEPENDENT_AMBULATORY_CARE_PROVIDER_SITE_OTHER): Payer: Self-pay

## 2020-09-05 ENCOUNTER — Ambulatory Visit (HOSPITAL_COMMUNITY): Payer: Self-pay

## 2020-09-06 ENCOUNTER — Telehealth: Payer: Managed Care, Other (non HMO) | Admitting: Emergency Medicine

## 2020-09-06 ENCOUNTER — Encounter (HOSPITAL_COMMUNITY): Payer: Self-pay

## 2020-09-06 ENCOUNTER — Other Ambulatory Visit: Payer: Self-pay

## 2020-09-06 ENCOUNTER — Ambulatory Visit (HOSPITAL_COMMUNITY)
Admission: EM | Admit: 2020-09-06 | Discharge: 2020-09-06 | Disposition: A | Discharge status: Home / Self Care | Payer: Managed Care, Other (non HMO) | Attending: Emergency Medicine | Admitting: Emergency Medicine

## 2020-09-06 DIAGNOSIS — S0990XA Unspecified injury of head, initial encounter: Secondary | ICD-10-CM

## 2020-09-06 DIAGNOSIS — S060X0A Concussion without loss of consciousness, initial encounter: Secondary | ICD-10-CM | POA: Diagnosis not present

## 2020-09-06 DIAGNOSIS — R110 Nausea: Secondary | ICD-10-CM

## 2020-09-06 MED ORDER — DEXAMETHASONE SODIUM PHOSPHATE 10 MG/ML IJ SOLN
INTRAMUSCULAR | Status: AC
  Filled 2020-09-06: qty 1

## 2020-09-06 MED ORDER — NAPROXEN 500 MG PO TABS: 500.0000 mg | ORAL_TABLET | Freq: Two times a day (BID) | ORAL | 0 refills | Status: AC

## 2020-09-06 MED ORDER — ONDANSETRON 4 MG PO TBDP: 4.0000 mg | ORAL_TABLET | Freq: Three times a day (TID) | ORAL | 0 refills | Status: AC | PRN

## 2020-09-06 MED ORDER — DEXAMETHASONE 10 MG/ML FOR PEDIATRIC ORAL USE
10.0000 mg | Freq: Once | INTRAMUSCULAR | Status: AC
  Administered 2020-09-06: 10 mg via ORAL

## 2020-09-06 MED ORDER — CYCLOBENZAPRINE HCL 5 MG PO TABS: 5.0000 mg | ORAL_TABLET | Freq: Two times a day (BID) | ORAL | 0 refills | Status: AC | PRN

## 2020-09-06 NOTE — Progress Notes (Signed)
Time spent : 5 min  Based on what you shared with me, I feel your condition warrants further evaluation and I recommend that you be seen for a face to face office visit.   NOTE: If you entered your credit card information for this eVisit, you will not be charged. You may see a "hold" on your card for the $35 but that hold will drop off and you will not have a charge processed.   If you are having a true medical emergency please call 911.      For an urgent face to face visit, [facility] has five urgent care centers for your convenience:     [facility] [place] [facility] [place] Get Driving Directions [phone] [address] [place], [place] [address] . 10 am - 6pm Monday - Friday    [facility]) Get Driving Directions [phone] [address] [place], [place] [address] . 10 am to 8 pm Monday-Friday . 12 pm to 8 pm [facility] Get Driving Directions [phone] [place], [place], [place] [address] . 8 am to 8 pm Monday-Friday . 9 am to 6 pm Saturday . 11 am to 6 pm Sunday     [facility] at [facility] Get Driving Directions  [phone] [address] [place], [place] [address] . 8 am to 8 pm Monday-Friday . 8 am to 4 pm [facility] at [place] Get Driving Directions [phone] [address] [place], [place] [address] . 12 pm to 6 pm Monday-Friday      Your e-visit answers were reviewed by a board certified advanced clinical practitioner to complete your personal care plan.  Thank you for using e-Visits.

## 2020-09-06 NOTE — ED Provider Notes (Signed)
[place]    CSN: [id number] Arrival date & time: 09/06/20  1215      History   Chief Complaint Chief Complaint  Patient presents with  . Head Injury    HPI Bradley Dougherty is a 24 y.o. male resenting today for evaluation of headache. Reports head injury approximately 3 to 4 weeks ago.  Hit left frontal area on corner of metal box while at work.  Denies LOC.  Did have immediate pain, headache worsened today to and had associated dizziness and nausea.  Denies any vision changes.  Over the past month since initial incident he has had intermittent headaches which have come and gone.  Headaches are in various places across the head.  Occasionally will have nausea.  Reports one episode of vomiting.  Typically taking Tylenol with improvement of headache.  Over the past couple days his headache has been more persistent and has reported associated neck stiffness.  He denies any fevers.  Denies any weakness or difficulty speaking.  Denies significant history of headaches prior to incident.  Denies photophobia.  Denies any difficulty concentrating.  Denies any recent dizziness or lightheadedness.  Reviewed allergies with patient, reports Stevens-Johnson syndrome which is believed to be more related to Reglan over ibuprofen, he was given these medicines together in the past.  He reports that he has taken ibuprofen for his headaches recently since without any reaction.   HPI  Past Medical History:  Diagnosis Date  . Leukemia ([place])   . Pneumonia   . Stevens-Johnson syndrome (HCC)     There are no problems to display for this patient.   History reviewed. No pertinent surgical history.     Home Medications    Prior to Admission medications   Medication Sig Start Date End Date Taking? Authorizing Provider  albuterol (VENTOLIN HFA) 108 (90 Base) MCG/ACT inhaler Inhale 1-2 puffs into the lungs every 6 (six) hours as needed for wheezing or shortness of breath. 10/15/19   Marisela Line,  Breean Nannini C, PA-C  cyclobenzaprine (FLEXERIL) 5 MG tablet Take 1-2 tablets (5-10 mg total) by mouth 2 (two) times daily as needed for muscle spasms. 09/06/20   Nimah Uphoff C, PA-C  dextromethorphan-guaiFENesin (MUCINEX DM) 30-600 MG 12hr tablet Take 1 tablet by mouth 2 (two) times daily. 10/15/19   Nuchem Grattan C, PA-C  loperamide (IMODIUM) 2 MG capsule Take 1 capsule (2 mg total) by mouth 4 (four) times daily as needed for diarrhea or loose stools. 10/09/19   Muthersbaugh, Jarrett Soho, PA-C  naproxen (NAPROSYN) 500 MG tablet Take 1 tablet (500 mg total) by mouth 2 (two) times daily. 09/06/20   Zuriel Yeaman C, PA-C  ondansetron (ZOFRAN ODT) 4 MG disintegrating tablet Take 1 tablet (4 mg total) by mouth every 8 (eight) hours as needed for nausea or vomiting. 09/06/20   Venora Kautzman C, PA-C  promethazine-dextromethorphan (PROMETHAZINE-DM) 6.25-15 MG/5ML syrup Take 5 mLs by mouth 4 (four) times daily as needed for cough. 10/09/19   Muthersbaugh, Jarrett Soho, PA-C    Family History Family History  Family history unknown: Yes    Social History Social History   Tobacco Use  . Smoking status: Never Smoker  . Smokeless tobacco: Never Used  Substance Use Topics  . Alcohol use: No  . Drug use: No     Allergies   Motrin [ibuprofen] and Reglan [metoclopramide]   Review of Systems Review of Systems  Constitutional: Negative for fatigue and fever.  HENT: Negative for congestion, sinus pressure and sore throat.  Eyes: Negative for photophobia, pain and visual disturbance.  Respiratory: Negative for cough and shortness of breath.   Cardiovascular: Negative for chest pain.  Gastrointestinal: Positive for nausea. Negative for abdominal pain and vomiting.  Genitourinary: Negative for decreased urine volume and hematuria.  Musculoskeletal: Negative for myalgias, neck pain and neck stiffness.  Neurological: Positive for headaches. Negative for dizziness, syncope, facial asymmetry, speech  difficulty, weakness, light-headedness and numbness.     Physical Exam Triage Vital Signs ED Triage Vitals  Enc Vitals Group     BP 09/06/20 1246 120/77     Pulse Rate 09/06/20 1246 60     Resp 09/06/20 1246 18     Temp 09/06/20 1246 98.2 F (36.8 C)     Temp Source 09/06/20 1246 Oral     SpO2 09/06/20 1246 96 %     Weight --      Height --      Head Circumference --      Peak Flow --      Pain Score 09/06/20 1245 5     Pain Loc --      Pain Edu? --      Excl. in [place]? --    No data found.  Updated Vital Signs BP 120/77 (BP Location: Right Arm)   Pulse 60   Temp 98.2 F (36.8 C) (Oral)   Resp 18   SpO2 96%   Visual Acuity Right Eye Distance:   Left Eye Distance:   Bilateral Distance:    Right Eye Near:   Left Eye Near:    Bilateral Near:     Physical Exam Vitals and nursing note reviewed.  Constitutional:      Appearance: He is well-developed.     Comments: No acute distress  HENT:     Head: Normocephalic and atraumatic.     Ears:     Comments: Bilateral ears without tenderness to palpation of external auricle, tragus and mastoid, EAC's without erythema or swelling, TM's with good bony landmarks and cone of light. Non erythematous.     Nose: Nose normal.     Mouth/Throat:     Comments: Oral mucosa pink and moist, no tonsillar enlargement or exudate. Posterior pharynx patent and nonerythematous, no uvula deviation or swelling. Normal phonation. Eyes:     Conjunctiva/sclera: Conjunctivae normal.  Cardiovascular:     Rate and Rhythm: Normal rate.  Pulmonary:     Effort: Pulmonary effort is normal. No respiratory distress.     Comments: Breathing comfortably at rest, CTABL, no wheezing, rales or other adventitious sounds auscultated Abdominal:     General: There is no distension.  Musculoskeletal:        General: Normal range of motion.     Cervical back: Neck supple.  Skin:    General: Skin is warm and dry.  Neurological:     General: No focal deficit  present.     Mental Status: He is alert and oriented to person, place, and time. Mental status is at baseline.     Comments: Patient A&O x3, cranial nerves II-XII grossly intact, strength at shoulders, hips and knees 5/5, equal bilaterally, patellar reflex 1+ bilaterally. . Negative Romberg and Pronator Drift. Gait without abnormality.      UC Treatments / Results  Labs (all labs ordered are listed, but only abnormal results are displayed) Labs Reviewed - No data to display  EKG   Radiology No results found.  Procedures Procedures (including critical care time)  Medications Ordered in  UC Medications  dexamethasone (DECADRON) 10 MG/ML injection for Pediatric ORAL use 10 mg (has no administration in time range)    Initial Impression / Assessment and Plan / UC Course  I have reviewed the triage vital signs and the nursing notes.  Pertinent labs & imaging results that were available during my care of the patient were reviewed by me and considered in my medical decision making (see chart for details).     Providing Decadron p.o. and avoiding Toradol/Reglan as part of migraine cocktail, given reported allergies.  Recommending continued treatment of symptoms with Tylenol, provided Naprosyn as he reports he has tolerated ibuprofen recently without reaction, Zofran for nausea and Flexeril for neck stiffness/spasming.  Recommended following up with sports medicine/primary care as he did have some concussion-like symptoms initially after incident as well as he has had new pattern of recurrent headaches, recommended follow-up with PCP, may need neurology referral if persistent. At baseline, no recent worsening or changes, do not feel patient warrants emergent imaging at this time.  Continue to monitor.  Discussed strict return precautions. Patient verbalized understanding and is agreeable with plan.  Final Clinical Impressions(s) / UC Diagnoses   Final diagnoses:  Minor head injury,  initial encounter  Concussion without loss of consciousness, initial encounter     Discharge Instructions     We gave you decadron Continue tylenol as needed for headaches, may try naprosyn as alternative Zofran for nausea- dissolves in mouth You may use flexeril as needed to help with neck pain/stiffness. This is a muscle relaxer and causes sedation- please use only at bedtime or when you will be home and not have to drive/work  Follow up with primary care/sports medicine for follow up of concussion symptoms/ new recurrent headaches     ED Prescriptions    Medication Sig Dispense Auth. Provider   naproxen (NAPROSYN) 500 MG tablet Take 1 tablet (500 mg total) by mouth 2 (two) times daily. 30 tablet Lindley Stachnik C, PA-C   ondansetron (ZOFRAN ODT) 4 MG disintegrating tablet Take 1 tablet (4 mg total) by mouth every 8 (eight) hours as needed for nausea or vomiting. 20 tablet Stephenson Cichy C, PA-C   cyclobenzaprine (FLEXERIL) 5 MG tablet Take 1-2 tablets (5-10 mg total) by mouth 2 (two) times daily as needed for muscle spasms. 24 tablet Jerel Sardina, [place] C, PA-C     PDMP not reviewed this encounter.   Janith Lima, PA-C 09/06/20 1326

## 2020-09-06 NOTE — Discharge Instructions (Signed)
We gave you decadron Continue tylenol as needed for headaches, may try naprosyn as alternative Zofran for nausea- dissolves in mouth You may use flexeril as needed to help with neck pain/stiffness. This is a muscle relaxer and causes sedation- please use only at bedtime or when you will be home and not have to drive/work  Follow up with primary care/sports medicine for follow up of concussion symptoms/ new recurrent headaches

## 2020-09-06 NOTE — ED Triage Notes (Signed)
Pt presents with ongoing left side head pain and nausea after hitting the front left side of a metal object about 2 or 3 weeks ago.

## 2020-09-15 ENCOUNTER — Encounter: Payer: Self-pay | Admitting: Family Medicine

## 2020-09-15 ENCOUNTER — Other Ambulatory Visit: Payer: Self-pay

## 2020-09-15 ENCOUNTER — Ambulatory Visit (INDEPENDENT_AMBULATORY_CARE_PROVIDER_SITE_OTHER): Payer: Managed Care, Other (non HMO) | Admitting: Family Medicine

## 2020-09-15 VITALS — BP 112/82 | Ht 67.0 in | Wt 128.0 lb

## 2020-09-15 DIAGNOSIS — S060X0A Concussion without loss of consciousness, initial encounter: Secondary | ICD-10-CM | POA: Diagnosis not present

## 2020-09-15 NOTE — Patient Instructions (Signed)
You have a concussion. Take tylenol as needed for headaches. Try to minimize movements, activities that make your headaches worse. Light cardio (stationary bike, walking) is good if it doesn't make your symptoms worse - studies show this will make you better faster if you can do some light exercise. If not improving we can consider propranolol for headaches, work restrictions. Follow up with me in 4 weeks.

## 2020-09-15 NOTE — Progress Notes (Signed)
PCP: Patient, No Pcp Per  Subjective:   HPI: Patient is a 24 y.o. male here for evaluation of headaches after hitting his head 4 weeks ago.  Patient was at work 4 weeks ago at [employer] and upon standing up he hit anterior and superior portion of his head on a metal cabinet. He immediately had pain, headache, dizziness, and nausea.  He has been treating his headache.  Recently seen at urgent care and prescribed Flexeril for neck stiffness.  On assessment with scat 3 today he has moderate headache and pressure in his head.  He has mild neck pain and sensitivity to light.  He has been more emotional and sad this month, but on further discussion he says this is probably because his father's birthday is this month.  His father passed 3 years ago during this month and it is a difficult time for him.  Denies any blurred vision, recent nausea or vomiting, fatigue or low energy.   Past Medical History:  Diagnosis Date  . Leukemia ([place])   . Pneumonia   . Stevens-Johnson syndrome ([place])     Current Outpatient Medications on File Prior to Visit  Medication Sig Dispense Refill  . albuterol (VENTOLIN HFA) 108 (90 Base) MCG/ACT inhaler Inhale 1-2 puffs into the lungs every 6 (six) hours as needed for wheezing or shortness of breath. 8 g 0  . cyclobenzaprine (FLEXERIL) 5 MG tablet Take 1-2 tablets (5-10 mg total) by mouth 2 (two) times daily as needed for muscle spasms. 24 tablet 0  . dextromethorphan-guaiFENesin (MUCINEX DM) 30-600 MG 12hr tablet Take 1 tablet by mouth 2 (two) times daily. 20 tablet 0  . loperamide (IMODIUM) 2 MG capsule Take 1 capsule (2 mg total) by mouth 4 (four) times daily as needed for diarrhea or loose stools. 12 capsule 0  . naproxen (NAPROSYN) 500 MG tablet Take 1 tablet (500 mg total) by mouth 2 (two) times daily. 30 tablet 0  . ondansetron (ZOFRAN ODT) 4 MG disintegrating tablet Take 1 tablet (4 mg total) by mouth every 8 (eight) hours as needed for nausea or vomiting. 20  tablet 0  . promethazine-dextromethorphan (PROMETHAZINE-DM) 6.25-15 MG/5ML syrup Take 5 mLs by mouth 4 (four) times daily as needed for cough. 118 mL 0   No current facility-administered medications on file prior to visit.    No past surgical history on file.  Allergies  Allergen Reactions  . Motrin [Ibuprofen] Anaphylaxis    Steven-Johnson Syndrome  . Reglan [Metoclopramide]     Social History   Socioeconomic History  . Marital status: Single    Spouse name: Not on file  . Number of children: Not on file  . Years of education: Not on file  . Highest education level: Not on file  Occupational History  . Not on file  Tobacco Use  . Smoking status: Never Smoker  . Smokeless tobacco: Never Used  Substance and Sexual Activity  . Alcohol use: No  . Drug use: No  . Sexual activity: Not on file  Other Topics Concern  . Not on file  Social History Narrative  . Not on file   Social Determinants of Health   Financial Resource Strain:   . Difficulty of Paying Living Expenses: Not on file  Food Insecurity:   . Worried About Charity fundraiser in the Last Year: Not on file  . Ran Out of Food in the Last Year: Not on file  Transportation Needs:   . Lack of  Transportation (Medical): Not on file  . Lack of Transportation (Non-Medical): Not on file  Physical Activity:   . Days of Exercise per Week: Not on file  . Minutes of Exercise per Session: Not on file  Stress:   . Feeling of Stress : Not on file  Social Connections:   . Frequency of Communication with Friends and Family: Not on file  . Frequency of Social Gatherings with Friends and Family: Not on file  . Attends Religious Services: Not on file  . Active Member of Clubs or Organizations: Not on file  . Attends Archivist Meetings: Not on file  . Marital Status: Not on file  Intimate Partner Violence:   . Fear of Current or Ex-Partner: Not on file  . Emotionally Abused: Not on file  . Physically Abused:  Not on file  . Sexually Abused: Not on file    Family History  Family history unknown: Yes    BP 112/82   Ht 5\' 7"  (1.702 m)   Wt 128 lb (58.1 kg)   BMI 20.05 kg/m   [place] Adult Exercise 09/15/2020  Frequency of aerobic exercise (# of days/week) 4  Average time in minutes 30  Frequency of strengthening activities (# of days/week) 4    No flowsheet data found.  Review of Systems: See HPI above.     Objective:  Physical Exam:  Gen: NAD, comfortable in exam room HEENT: [place]/AT , PERRL, no tenderness to palpation of neck Neuro: Motor: Good effort thorughout, 5/5 bilateral UE Sensory: Sensation is grossly intact bilateral UEs  A&O x 4, finger to nose testing nl, immediate memory and delayed recall normal, concentration normal ,   ( See SCAT 3 - patient had 7 symptoms, with score of 13)   Assessment & Plan:  1. Concussion Patient has mild symptoms after 4 weeks since hitting his head.  The most predominant symptom is headache and pressure in his head which he rated moderate on scat 3.  Discussed work restrictions, but patient feels his symptoms are not bothersome enough to need this at this time and gradually improving.  He is not having a daily problem with balance, so vestibular PT was not ordered at today's visit. Will not refer for CBT given patient is not having difficulty with cognitive skills at this time.  Plan:  -Tylenol as needed for headaches -Light cardio was recommended for faster recovery ( stationary bike,walkiing) -If no improvement in headaches, consider starting propranolol for headaches and place work restrictions at next visit  Follow up with Dr.Hudnall in 4 weeks.

## 2020-10-13 ENCOUNTER — Ambulatory Visit: Payer: Managed Care, Other (non HMO) | Admitting: Family Medicine

## 2024-11-22 ENCOUNTER — Ambulatory Visit: Payer: Self-pay | Admitting: Primary Care

## 2024-12-18 ENCOUNTER — Ambulatory Visit: Admitting: Primary Care
